# Patient Record
Sex: Female | Born: 2000 | Race: Black or African American | Hispanic: No | Marital: Single | Smoking: Never smoker
Health system: Southern US, Community
[De-identification: ages and names within clinical notes are randomized; demographics above are authoritative.]

## PROBLEM LIST (undated history)

## (undated) DIAGNOSIS — Z9049 Acquired absence of other specified parts of digestive tract: Secondary | ICD-10-CM

## (undated) DIAGNOSIS — J45909 Unspecified asthma, uncomplicated: Secondary | ICD-10-CM

## (undated) HISTORY — PX: KNEE SURGERY: SHX244

## (undated) HISTORY — PX: COLON SURGERY: SHX602

---

## 2020-01-05 ENCOUNTER — Ambulatory Visit: Payer: Self-pay

## 2020-01-22 ENCOUNTER — Ambulatory Visit: Payer: Managed Care, Other (non HMO) | Attending: Family

## 2020-01-22 DIAGNOSIS — Z23 Encounter for immunization: Secondary | ICD-10-CM

## 2020-02-16 ENCOUNTER — Encounter (HOSPITAL_COMMUNITY): Payer: Self-pay | Admitting: *Deleted

## 2020-02-16 ENCOUNTER — Emergency Department (HOSPITAL_COMMUNITY): Payer: Managed Care, Other (non HMO)

## 2020-02-16 ENCOUNTER — Other Ambulatory Visit: Payer: Self-pay

## 2020-02-16 ENCOUNTER — Observation Stay (HOSPITAL_COMMUNITY)
Admission: EM | Admit: 2020-02-16 | Discharge: 2020-02-17 | Disposition: A | Discharge status: Home / Self Care | Payer: Managed Care, Other (non HMO) | Attending: Internal Medicine | Admitting: Internal Medicine

## 2020-02-16 DIAGNOSIS — R6510 Systemic inflammatory response syndrome (SIRS) of non-infectious origin without acute organ dysfunction: Secondary | ICD-10-CM | POA: Diagnosis not present

## 2020-02-16 DIAGNOSIS — K8080 Other cholelithiasis without obstruction: Secondary | ICD-10-CM | POA: Insufficient documentation

## 2020-02-16 DIAGNOSIS — R000 Tachycardia, unspecified: Secondary | ICD-10-CM | POA: Diagnosis not present

## 2020-02-16 DIAGNOSIS — J45909 Unspecified asthma, uncomplicated: Secondary | ICD-10-CM | POA: Diagnosis not present

## 2020-02-16 DIAGNOSIS — R197 Diarrhea, unspecified: Principal | ICD-10-CM | POA: Diagnosis present

## 2020-02-16 DIAGNOSIS — K760 Fatty (change of) liver, not elsewhere classified: Secondary | ICD-10-CM | POA: Diagnosis not present

## 2020-02-16 DIAGNOSIS — Z20822 Contact with and (suspected) exposure to covid-19: Secondary | ICD-10-CM | POA: Insufficient documentation

## 2020-02-16 DIAGNOSIS — R112 Nausea with vomiting, unspecified: Secondary | ICD-10-CM | POA: Diagnosis present

## 2020-02-16 HISTORY — DX: Acquired absence of other specified parts of digestive tract: Z90.49

## 2020-02-16 HISTORY — DX: Unspecified asthma, uncomplicated: J45.909

## 2020-02-16 LAB — URINALYSIS, ROUTINE W REFLEX MICROSCOPIC
Bacteria, UA: NONE SEEN
Bilirubin Urine: NEGATIVE
Glucose, UA: NEGATIVE mg/dL
Hgb urine dipstick: NEGATIVE
Ketones, ur: NEGATIVE mg/dL
Leukocytes,Ua: NEGATIVE
Nitrite: NEGATIVE
Protein, ur: 30 mg/dL — AB
Specific Gravity, Urine: 1.033 — ABNORMAL HIGH (ref 1.005–1.030)
pH: 5 (ref 5.0–8.0)

## 2020-02-16 LAB — LIPASE, BLOOD: Lipase: 31 U/L (ref 11–51)

## 2020-02-16 LAB — CBC
HCT: 43.6 % (ref 36.0–46.0)
Hemoglobin: 14.6 g/dL (ref 12.0–15.0)
MCH: 28.8 pg (ref 26.0–34.0)
MCHC: 33.5 g/dL (ref 30.0–36.0)
MCV: 86 fL (ref 80.0–100.0)
Platelets: 321 10*3/uL (ref 150–400)
RBC: 5.07 MIL/uL (ref 3.87–5.11)
RDW: 13.2 % (ref 11.5–15.5)
WBC: 21.1 10*3/uL — ABNORMAL HIGH (ref 4.0–10.5)
nRBC: 0 % (ref 0.0–0.2)

## 2020-02-16 LAB — TROPONIN I (HIGH SENSITIVITY)
Troponin I (High Sensitivity): <2 ng/L (ref <18)
Troponin I (High Sensitivity): <2 ng/L (ref <18)

## 2020-02-16 LAB — COMPREHENSIVE METABOLIC PANEL
ALT: 20 U/L (ref 0–44)
AST: 22 U/L (ref 15–41)
Albumin: 4.4 g/dL (ref 3.5–5.0)
Alkaline Phosphatase: 66 U/L (ref 38–126)
Anion gap: 12 (ref 5–15)
BUN: 13 mg/dL (ref 6–20)
CO2: 18 mmol/L — ABNORMAL LOW (ref 22–32)
Calcium: 9.5 mg/dL (ref 8.9–10.3)
Chloride: 104 mmol/L (ref 98–111)
Creatinine, Ser: 0.95 mg/dL (ref 0.44–1.00)
GFR, Estimated: >60 mL/min (ref >60)
Glucose, Bld: 106 mg/dL — ABNORMAL HIGH (ref 70–99)
Potassium: 3.8 mmol/L (ref 3.5–5.1)
Sodium: 134 mmol/L — ABNORMAL LOW (ref 135–145)
Total Bilirubin: 1.2 mg/dL (ref 0.3–1.2)
Total Protein: 8.5 g/dL — ABNORMAL HIGH (ref 6.5–8.1)

## 2020-02-16 LAB — D-DIMER, QUANTITATIVE: D-Dimer, Quant: 0.44 ug/mL-FEU (ref 0.00–0.50)

## 2020-02-16 LAB — TSH: TSH: 1.335 u[IU]/mL (ref 0.350–4.500)

## 2020-02-16 LAB — I-STAT BETA HCG BLOOD, ED (MC, WL, AP ONLY): I-stat hCG, quantitative: <5 m[IU]/mL (ref <5)

## 2020-02-16 LAB — SARS CORONAVIRUS 2 (TAT 6-24 HRS): SARS Coronavirus 2: NEGATIVE

## 2020-02-16 MED ORDER — ACETAMINOPHEN 325 MG PO TABS
650.0000 mg | ORAL_TABLET | Freq: Once | ORAL | Status: AC
  Administered 2020-02-16: 650 mg via ORAL
  Filled 2020-02-16: qty 2

## 2020-02-16 MED ORDER — SODIUM CHLORIDE 0.9 % IV BOLUS
1000.0000 mL | Freq: Once | INTRAVENOUS | Status: AC
  Administered 2020-02-16: 1000 mL via INTRAVENOUS

## 2020-02-16 MED ORDER — IOHEXOL 300 MG/ML  SOLN
80.0000 mL | Freq: Once | INTRAMUSCULAR | Status: AC | PRN
  Administered 2020-02-16: 80 mL via INTRAVENOUS

## 2020-02-16 MED ORDER — DIPHENHYDRAMINE HCL 50 MG/ML IJ SOLN: 25.0000 mg | Freq: Once | INTRAMUSCULAR | Status: DC
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
## 2020-02-16 NOTE — ED Triage Notes (Signed)
Pt reports having n/v/d since getting up this am. No acute distress noted at triage.
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
## 2020-02-16 NOTE — ED Notes (Addendum)
This RN notified PA if another bag of fluids should be given due to pt still being sinus tach after 1L of NS already administered

## 2020-02-17 ENCOUNTER — Encounter (HOSPITAL_COMMUNITY): Payer: Self-pay | Admitting: Internal Medicine

## 2020-02-17 ENCOUNTER — Observation Stay (HOSPITAL_BASED_OUTPATIENT_CLINIC_OR_DEPARTMENT_OTHER): Payer: Managed Care, Other (non HMO)

## 2020-02-17 DIAGNOSIS — R000 Tachycardia, unspecified: Secondary | ICD-10-CM

## 2020-02-17 DIAGNOSIS — R112 Nausea with vomiting, unspecified: Secondary | ICD-10-CM

## 2020-02-17 DIAGNOSIS — R6510 Systemic inflammatory response syndrome (SIRS) of non-infectious origin without acute organ dysfunction: Secondary | ICD-10-CM

## 2020-02-17 DIAGNOSIS — R9431 Abnormal electrocardiogram [ECG] [EKG]: Secondary | ICD-10-CM | POA: Diagnosis not present

## 2020-02-17 LAB — MAGNESIUM: Magnesium: 1.2 mg/dL — ABNORMAL LOW (ref 1.7–2.4)

## 2020-02-17 LAB — CBC
HCT: 35.9 % — ABNORMAL LOW (ref 36.0–46.0)
Hemoglobin: 11.6 g/dL — ABNORMAL LOW (ref 12.0–15.0)
MCH: 28.2 pg (ref 26.0–34.0)
MCHC: 32.3 g/dL (ref 30.0–36.0)
MCV: 87.3 fL (ref 80.0–100.0)
Platelets: 230 10*3/uL (ref 150–400)
RBC: 4.11 MIL/uL (ref 3.87–5.11)
RDW: 13.2 % (ref 11.5–15.5)
WBC: 7 10*3/uL (ref 4.0–10.5)
nRBC: 0 % (ref 0.0–0.2)

## 2020-02-17 LAB — COMPREHENSIVE METABOLIC PANEL
ALT: 14 U/L (ref 0–44)
AST: 18 U/L (ref 15–41)
Albumin: 3.1 g/dL — ABNORMAL LOW (ref 3.5–5.0)
Alkaline Phosphatase: 63 U/L (ref 38–126)
Anion gap: 8 (ref 5–15)
BUN: 5 mg/dL — ABNORMAL LOW (ref 6–20)
CO2: 19 mmol/L — ABNORMAL LOW (ref 22–32)
Calcium: 8 mg/dL — ABNORMAL LOW (ref 8.9–10.3)
Chloride: 108 mmol/L (ref 98–111)
Creatinine, Ser: 0.72 mg/dL (ref 0.44–1.00)
GFR, Estimated: >60 mL/min (ref >60)
Glucose, Bld: 88 mg/dL (ref 70–99)
Potassium: 3.5 mmol/L (ref 3.5–5.1)
Sodium: 135 mmol/L (ref 135–145)
Total Bilirubin: 1.3 mg/dL — ABNORMAL HIGH (ref 0.3–1.2)
Total Protein: 6.2 g/dL — ABNORMAL LOW (ref 6.5–8.1)

## 2020-02-17 LAB — ECHOCARDIOGRAM COMPLETE
Area-P 1/2: 2.8 cm2
Height: 59 in
S' Lateral: 2.3 cm
Weight: 1968 oz

## 2020-02-17 LAB — HIV ANTIBODY (ROUTINE TESTING W REFLEX): HIV Screen 4th Generation wRfx: NONREACTIVE

## 2020-02-17 LAB — RAPID URINE DRUG SCREEN, HOSP PERFORMED
Amphetamines: NOT DETECTED
Barbiturates: NOT DETECTED
Benzodiazepines: NOT DETECTED
Cocaine: NOT DETECTED
Opiates: NOT DETECTED
Tetrahydrocannabinol: POSITIVE — AB

## 2020-02-17 MED ORDER — ONDANSETRON HCL 4 MG PO TABS: 4.0000 mg | ORAL_TABLET | Freq: Four times a day (QID) | ORAL | Status: DC | PRN

## 2020-02-17 MED ORDER — SODIUM CHLORIDE 0.9 % IV SOLN: INTRAVENOUS | Status: DC

## 2020-02-17 MED ORDER — ACETAMINOPHEN 650 MG RE SUPP: 650.0000 mg | Freq: Four times a day (QID) | RECTAL | Status: DC | PRN

## 2020-02-17 MED ORDER — ACETAMINOPHEN 325 MG PO TABS: 650.0000 mg | ORAL_TABLET | Freq: Four times a day (QID) | ORAL | Status: DC | PRN

## 2020-02-17 MED ORDER — ONDANSETRON HCL 4 MG/2ML IJ SOLN: 4.0000 mg | Freq: Four times a day (QID) | INTRAMUSCULAR | Status: DC | PRN

## 2020-02-17 MED ORDER — MAGNESIUM SULFATE 2 GM/50ML IV SOLN
2.0000 g | Freq: Once | INTRAVENOUS | Status: AC
  Administered 2020-02-17: 2 g via INTRAVENOUS
  Filled 2020-02-17: qty 50

## 2020-02-17 MED ORDER — SODIUM CHLORIDE 0.9 % IV BOLUS
1000.0000 mL | Freq: Once | INTRAVENOUS | Status: AC
  Administered 2020-02-17: 1000 mL via INTRAVENOUS

## 2020-02-17 MED ORDER — MAGNESIUM SULFATE IN D5W 1-5 GM/100ML-% IV SOLN: 1.0000 g | Freq: Once | INTRAVENOUS | Status: DC

## 2020-02-17 MED ORDER — POTASSIUM CHLORIDE CRYS ER 20 MEQ PO TBCR
40.0000 meq | EXTENDED_RELEASE_TABLET | Freq: Once | ORAL | Status: AC
  Administered 2020-02-17: 40 meq via ORAL
  Filled 2020-02-17: qty 2

## 2020-02-17 NOTE — Plan of Care (Signed)
Pt and mother understanding of plan of care

## 2020-02-17 NOTE — Discharge Instructions (Signed)
Follow with Primary MD /appointment has been arranged with [facility]  Get CBC, CMP,Mg, EKG during next visit.    Activity: As tolerated with Full fall precautions use walker/cane & assistance as needed   Disposition Home   Diet: Regular.   On your next visit with your primary care physician please Get Medicines reviewed and adjusted.   Please request your Prim.MD to go over all Hospital Tests and Procedure/Radiological results at the follow up, please get all Hospital records sent to your Prim MD by signing hospital release before you go home.   If you experience worsening of your admission symptoms, develop shortness of breath, life threatening emergency, suicidal or homicidal thoughts you must seek medical attention immediately by calling 911 or calling your MD immediately  if symptoms less severe.  You Must read complete instructions/literature along with all the possible adverse reactions/side effects for all the Medicines you take and that have been prescribed to you. Take any new Medicines after you have completely understood and accpet all the possible adverse reactions/side effects.   Do not drive, operating heavy machinery, perform activities at heights, swimming or participation in water activities or provide baby sitting services if your were admitted for syncope or siezures until you have seen by Primary MD or a Neurologist and advised to do so again.  Do not drive when taking Pain medications.    Do not take more than prescribed Pain, Sleep and Anxiety Medications  Special Instructions: If you have smoked or chewed Tobacco  in the last 2 yrs please stop smoking, stop any regular Alcohol  and or any Recreational drug use.  Wear Seat belts while driving.   Please note  You were cared for by a hospitalist during your hospital stay. If you have any questions about your discharge medications or the care you received while you were in the hospital after you  are discharged, you can call the unit and asked to speak with the hospitalist on call if the hospitalist that took care of you is not available. Once you are discharged, your primary care physician will handle any further medical issues. Please note that NO REFILLS for any discharge medications will be authorized once you are discharged, as it is imperative that you return to your primary care physician (or establish a relationship with a primary care physician if you do not have one) for your aftercare needs so that they can reassess your need for medications and monitor your lab values.

## 2020-02-17 NOTE — Discharge Summary (Signed)
Katrina Dominguez, is a 20 y.o. female  DOB [birth year]  MRN [id number].  Admission date:  02/16/2020  Admitting Physician  Rometta Emery, MD  Discharge Date:  02/17/2020   Primary MD  Pcp, No  Recommendations for primary care physician for things to follow:  -Please check CBC, CMP, magnesium level during next visit. -Repeat EKG during next visit -Please consider referral to cardiology if she remains tachycardic.   Admission Diagnosis  Tachycardia [R00.0] SIRS (systemic inflammatory response syndrome) (HCC) [R65.10] Nausea vomiting and diarrhea [R11.2, R19.7]   Discharge Diagnosis  Tachycardia [R00.0] SIRS (systemic inflammatory response syndrome) (HCC) [R65.10] Nausea vomiting and diarrhea [R11.2, R19.7]    Principal Problem:   SIRS (systemic inflammatory response syndrome) (HCC) Active Problems:   Sinus tachycardia   Nausea & vomiting   Diarrhea      Past Medical History:  Diagnosis Date  . Asthma   . S/P small bowel resection     History reviewed. No pertinent surgical history.     History of present illness and  Hospital Course:     Kindly see H&P for history of present illness and admission details, please review complete Labs, Consult reports and Test reports for all details in brief  HPI  from the history and physical done on the day of admission 02/16/2020  HPI: Katrina Dominguez is a 20 y.o. female with medical history significant of asthma and previous small bowel resection who presented to the ER with significant history of nausea vomiting diarrhea since yesterday followed by weakness.  Patient was seen in the ER and noted to have significant sinus tachycardia.  She was given fluid boluses.  Also treated symptomatically.  Nausea vomiting and diarrhea have since stopped.  Patient's tachycardia has persisted.  Is been going into the 140s and 150s.  Patient not showing any signs of  improvement.  No chest pain.  Denied any drug use.  Patient has been anxious.  Reported having a similar episode before when she was told she was only anxious.  At this point there is worrisome for possible cardiomyopathy.  Drug screen is however not done yet by the ER.  Patient will be admitted for observation.  We may order an echocardiogram if drug screen is negative..  ED Course: Temperature 100.5 blood pressure 93/70 pulse 146 respirate of 35 oxygen sat 97% on room air.  Urinalysis essentially negative.  COVID-19 negative TSH 1.335.  Troponin negative D-dimer 0.44.  Chest x-ray shows no significant findings.  Urinalysis negative.  Hospital Course   Nausea/vomiting and diarrhea -This has resolved, CT abdomen pelvis with no acute findings, this is most likely due to to food poisoning which has currently resolved she reports she did eat some warm mashed potatoes, tolerating oral intake with no nausea or vomiting, no abdominal pain. -Leukocytosis most likely stress related from her nausea and vomiting, this has resolved with hydration, she is nontoxic-appearing.  Sinus tachycardia -This is likely chronic problem as she reports she has been told to follow-up with cardiology as  an outpatient during ED visit given her sinus tachycardia, this has significantly improved with IV hydration, there is some underlying dehydration contributing to it, but she has been appropriately fluid resuscitated, rate in the 90s at rest, but up to 120s with activity. -2D echo was obtained, with no acute findings, repeat EKG with normal sinus rhythm, no acute findings, I have discussed with cardiology on-call Dr. Reggy EyeAcharryia  who reviewed her EKG, no acute findings.  She is to repeat EKG during next visit at PCP, and if she remains tachycardic then would recommend outpatient referral to cardiology for further evaluation. -TSH within normal limit  Hypomagnesemia -Repleted    Discharge Condition:  stable   Follow  UP   Follow-up Information    [facility] Follow up.   Why: March 11, 2020 at 1:50 pm Contact information: [address] [place] [id number] [phone]                Discharge Instructions  and  Discharge Medications     Discharge Instructions    Diet - low sodium heart healthy   Complete by: As directed    Discharge instructions   Complete by: As directed    Follow with Primary MD /appointment has been arranged with [facility]  Get CBC, CMP,Mg, EKG during next visit.    Activity: As tolerated with Full fall precautions use walker/cane & assistance as needed   Disposition Home   Diet: Regular.   On your next visit with your primary care physician please Get Medicines reviewed and adjusted.   Please request your Prim.MD to go over all Hospital Tests and Procedure/Radiological results at the follow up, please get all Hospital records sent to your Prim MD by signing hospital release before you go home.   If you experience worsening of your admission symptoms, develop shortness of breath, life threatening emergency, suicidal or homicidal thoughts you must seek medical attention immediately by calling 911 or calling your MD immediately  if symptoms less severe.  You Must read complete instructions/literature along with all the possible adverse reactions/side effects for all the Medicines you take and that have been prescribed to you. Take any new Medicines after you have completely understood and accpet all the possible adverse reactions/side effects.   Do not drive, operating heavy machinery, perform activities at heights, swimming or participation in water activities or provide baby sitting services if your were admitted for syncope or siezures until you have seen by Primary MD or a Neurologist and advised to do so again.  Do not drive when taking Pain medications.    Do not take more than  prescribed Pain, Sleep and Anxiety Medications  Special Instructions: If you have smoked or chewed Tobacco  in the last 2 yrs please stop smoking, stop any regular Alcohol  and or any Recreational drug use.  Wear Seat belts while driving.   Please note  You were cared for by a hospitalist during your hospital stay. If you have any questions about your discharge medications or the care you received while you were in the hospital after you are discharged, you can call the unit and asked to speak with the hospitalist on call if the hospitalist that took care of you is not available. Once you are discharged, your primary care physician will handle any further medical issues. Please note that NO REFILLS for any discharge medications will be authorized once you are discharged, as it is imperative  that you return to your primary care physician (or establish a relationship with a primary care physician if you do not have one) for your aftercare needs so that they can reassess your need for medications and monitor your lab values.   Increase activity slowly   Complete by: As directed      Allergies as of 02/17/2020   No Known Allergies     Medication List    You have not been prescribed any medications.       Diet and Activity recommendation: See Discharge Instructions above   Consults obtained -  None   Major procedures and Radiology Reports - PLEASE review detailed and final reports for all details, in brief -      CT Abdomen Pelvis W Contrast  Result Date: 02/16/2020 CLINICAL DATA:  Abdominal pain for 1 day EXAM: CT ABDOMEN AND PELVIS WITH CONTRAST TECHNIQUE: Multidetector CT imaging of the abdomen and pelvis was performed using the standard protocol following bolus administration of intravenous contrast. CONTRAST:  80mL OMNIPAQUE IOHEXOL 300 MG/ML  SOLN COMPARISON:  None. FINDINGS: Lower chest: No acute abnormality. Hepatobiliary: Fatty infiltration of the liver is noted.  Gallbladder is decompressed with gallstones within. No complicating factors are noted. Pancreas: Unremarkable. No pancreatic ductal dilatation or surrounding inflammatory changes. Spleen: Normal in size without focal abnormality. Adrenals/Urinary Tract: Adrenal glands are within normal limits. Kidneys demonstrate a normal enhancement pattern bilaterally. No renal calculi or obstructive changes are seen. The bladder is decompressed. Stomach/Bowel: The appendix is not well visualized. Postsurgical changes in proximal right colon are noted. The anastomotic site is widely patent. No obstructive changes are seen. Vascular/Lymphatic: No significant vascular findings are present. No enlarged abdominal or pelvic lymph nodes. Reproductive: Uterus and bilateral adnexa are unremarkable. Other: No abdominal wall hernia or abnormality. No abdominopelvic ascites. Musculoskeletal: No acute or significant osseous findings. IMPRESSION: Fatty liver. Cholelithiasis without complicating factors. Postsurgical changes in the right lower quadrant consistent with the prior small bowel resection. No other focal abnormality is noted. Electronically Signed   By: Alcide Clever M.D.   On: 02/16/2020 12:57   DG Chest Portable 1 View  Result Date: 02/16/2020 CLINICAL DATA:  Tachycardia, nausea/vomiting/diarrhea for 1 day EXAM: PORTABLE CHEST 1 VIEW COMPARISON:  None FINDINGS: Single frontal view of the chest excludes the apices by collimation. No airspace disease, effusion, or pneumothorax. There are no acute bony abnormalities. IMPRESSION: 1. No acute intrathoracic process. Electronically Signed   By: Sharlet Salina M.D.   On: 02/16/2020 20:09   ECHOCARDIOGRAM COMPLETE  Result Date: 02/17/2020    ECHOCARDIOGRAM REPORT   Patient Name:   Katrina Dominguez Date of Exam: 02/17/2020 Medical Rec #:  [id number]    Height:       59.0 in Accession #:    [id number]   Weight:       123.0 lb Date of Birth:  24-Nov-2000    BSA:          1.500 m Patient Age:     19 years     BP:           96/64 mmHg Patient Gender: F            HR:           103 bpm. Exam Location:  Inpatient Procedure: 2D Echo Indications:    Sinus tachycardia [[id number]  History:        Patient has no prior history of Echocardiogram examinations.  Risk Factors:Non-Smoker. SIRS, Sinus Tachycardia, Small bowel                 resection.  Sonographer:    Jeryl Columbia Referring Phys: [address] [place] IMPRESSIONS  1. Left ventricular ejection fraction, by estimation, is 60 to 65%. The left ventricle has normal function. The left ventricle has no regional wall motion abnormalities. Left ventricular diastolic parameters were normal.  2. Right ventricular systolic function is normal. The right ventricular size is normal. There is normal pulmonary artery systolic pressure.  3. The mitral valve is normal in structure. No evidence of mitral valve regurgitation. No evidence of mitral stenosis.  4. The aortic valve is normal in structure. Aortic valve regurgitation is not visualized. No aortic stenosis is present.  5. The inferior vena cava is normal in size with greater than 50% respiratory variability, suggesting right atrial pressure of 3 mmHg. FINDINGS  Left Ventricle: Left ventricular ejection fraction, by estimation, is 60 to 65%. The left ventricle has normal function. The left ventricle has no regional wall motion abnormalities. The left ventricular internal cavity size was normal in size. There is  no left ventricular hypertrophy. Left ventricular diastolic parameters were normal. Right Ventricle: The right ventricular size is normal. No increase in right ventricular wall thickness. Right ventricular systolic function is normal. There is normal pulmonary artery systolic pressure. The tricuspid regurgitant velocity is 1.86 m/s, and  with an assumed right atrial pressure of 3 mmHg, the estimated right ventricular systolic pressure is 16.8 mmHg. Left Atrium: Left atrial size was normal in  size. Right Atrium: Right atrial size was normal in size. Pericardium: There is no evidence of pericardial effusion. Mitral Valve: The mitral valve is normal in structure. No evidence of mitral valve regurgitation. No evidence of mitral valve stenosis. Tricuspid Valve: The tricuspid valve is normal in structure. Tricuspid valve regurgitation is trivial. No evidence of tricuspid stenosis. Aortic Valve: The aortic valve is normal in structure. Aortic valve regurgitation is not visualized. No aortic stenosis is present. Pulmonic Valve: The pulmonic valve was normal in structure. Pulmonic valve regurgitation is not visualized. No evidence of pulmonic stenosis. Aorta: The aortic root is normal in size and structure. Venous: The inferior vena cava is normal in size with greater than 50% respiratory variability, suggesting right atrial pressure of 3 mmHg. IAS/Shunts: No atrial level shunt detected by color flow Doppler.  LEFT VENTRICLE PLAX 2D LVIDd:         3.30 cm  Diastology LVIDs:         2.30 cm  LV e' medial:    13.60 cm/s LV PW:         1.00 cm  LV E/e' medial:  9.4 LV IVS:        0.90 cm  LV e' lateral:   15.90 cm/s LVOT diam:     1.70 cm  LV E/e' lateral: 8.1 LVOT Area:     2.27 cm  RIGHT VENTRICLE RV S prime:     17.60 cm/s TAPSE (M-mode): 1.9 cm LEFT ATRIUM           Index       RIGHT ATRIUM           Index LA diam:      2.60 cm 1.73 cm/m  RA Area:     10.60 cm LA Vol (A2C): 23.9 ml 15.93 ml/m RA Volume:   26.30 ml  17.53 ml/m LA Vol (A4C): 29.8 ml 19.87 ml/m  AORTA Ao Root diam: 2.60 cm MITRAL VALVE                TRICUSPID VALVE MV Area (PHT): 2.80 cm     TR Peak grad:   13.8 mmHg MV Decel Time: 271 msec     TR Vmax:        186.00 cm/s MV E velocity: 128.00 cm/s                             SHUNTS                             Systemic Diam: 1.70 cm Rachelle Hora Croitoru MD Electronically signed by Thurmon Fair MD Signature Date/Time: 02/17/2020/10:38:12 AM    Final     Micro Results   Recent Results (from  the past 240 hour(s))  SARS CORONAVIRUS 2 (TAT 6-24 HRS) Nasopharyngeal Nasopharyngeal Swab     Status: None   Collection Time: 02/16/20  1:35 PM   Specimen: Nasopharyngeal Swab  Result Value Ref Range Status   SARS Coronavirus 2 NEGATIVE NEGATIVE Final    Comment: (NOTE) SARS-CoV-2 target nucleic acids are NOT DETECTED.  The SARS-CoV-2 RNA is generally detectable in upper and lower respiratory specimens during the acute phase of infection. Negative results do not preclude SARS-CoV-2 infection, do not rule out co-infections with other pathogens, and should not be used as the sole basis for treatment or other patient management decisions. Negative results must be combined with clinical observations, patient history, and epidemiological information. The expected result is Negative.  Fact Sheet for Patients: HairSlick.no  Fact Sheet for Healthcare Providers: quierodirigir.com  This test is not yet approved or cleared by the [place] FDA and  has been authorized for detection and/or diagnosis of SARS-CoV-2 by FDA under an Emergency Use Authorization (EUA). This EUA will remain  in effect (meaning this test can be used) for the duration of the COVID-19 declaration under Se ction 564(b)(1) of the Act, 21 U.S.C. section 360bbb-3(b)(1), unless the authorization is terminated or revoked sooner.  Performed at [facility], [address] [place], [place] [address]        Today   Subjective:   Katrina Dominguez today has no headache,no chest or  abdominal pain, no nausea, no vomiting currently, tolerating oral intake, no dizziness or lightheadedness.    Objective:   Blood pressure 101/69, pulse 93, temperature 98.2 F (36.8 C), temperature source Oral, resp. rate 14, height 4\' 11"  (1.499 m), weight 55.8 kg, last menstrual period 01/10/2020, SpO2 100 %.   Intake/Output Summary (Last 24 hours) at 02/17/2020 1511 Last  data filed at 02/17/2020 1509 Gross per 24 hour  Intake 4042.88 ml  Output -  Net 4042.88 ml    Exam Awake Alert, Oriented x 3, No new F.N deficits, Normal affect Symmetrical Chest wall movement, Good air movement bilaterally, CTAB RRR,No Gallops,Rubs or new Murmurs, No Parasternal Heave +ve B.Sounds, Abd Soft, Non tender, No rebound -guarding or rigidity. No Cyanosis, Clubbing or edema, No new Rash or bruise  Data Review   CBC w Diff:  Lab Results  Component Value Date   WBC 7.0 02/17/2020   HGB 11.6 (L) 02/17/2020   HCT 35.9 (L) 02/17/2020   PLT 230 02/17/2020    CMP:  Lab Results  Component Value Date   NA 135 02/17/2020   K 3.5 02/17/2020  CL 108 02/17/2020   CO2 19 (L) 02/17/2020   BUN 5 (L) 02/17/2020   CREATININE 0.72 02/17/2020   PROT 6.2 (L) 02/17/2020   ALBUMIN 3.1 (L) 02/17/2020   BILITOT 1.3 (H) 02/17/2020   ALKPHOS 63 02/17/2020   AST 18 02/17/2020   ALT 14 02/17/2020  .   Total Time in preparing paper work, data evaluation and todays exam - 35 minutes  Huey Bienenstock M.D on 02/17/2020 at 3:11 PM  [facility]   Office  [phone]

## 2020-02-17 NOTE — Progress Notes (Signed)
*  PRELIMINARY RESULTS* Echocardiogram 2D Echocardiogram has been performed.  Katrina Dominguez 02/17/2020, 10:14 AM

## 2020-02-17 NOTE — Progress Notes (Signed)
Mariah Milling to be D/C'd Home per MD order.  Discussed with the patient and all questions fully answered.   VSS, Skin clean, dry and intact without evidence of skin break down, no evidence of skin tears noted. IV catheter discontinued intact. Site without signs and symptoms of complications. Dressing and pressure applied.   An After Visit Summary was printed and given to the patient.    D/C education completed with patient/family including follow up instructions, medication list, d/c activities limitations if indicated, with other d/c instructions as indicated by MD - patient able to verbalize understanding, all questions fully answered.    Patient instructed to return to ED, call 911, or call MD for any changes in condition.    Patient escorted via WC, and D/C home via private car with mother.

## 2020-02-17 NOTE — TOC Initial Note (Signed)
Transition of Care [facility]) - Initial/Assessment Note    Patient Details  Name: Katrina Dominguez MRN: [id number] Date of Birth: 09/16/2000  [facility] CM/SW Contact:    Kingsley Plan, RN Phone Number: 02/17/2020, 1:36 PM  Clinical Narrative:                 Patient from home. Needs PCP.   NCM explained she can call number on insurance card and be provided with a complete list of providers in network with her insurance. Or if she knows of a provider she wants to establish care with , she can call that providers office directly and see if they are accepting new patient's.   NCM discussed [facility]. Patient consented for NCM to call [facility] and schedule an appointment. Same done and placed on AVS   Expected Discharge Plan: Home/Self Care     Patient Goals and CMS Choice   CMS Medicare.gov Compare Post Acute Care list provided to:: Patient Choice offered to / list presented to : Patient (PCP)  Expected Discharge Plan and Services Expected Discharge Plan: Home/Self Care In-house Referral: PCP / [facility] Discharge Planning Services: CM Consult   Living arrangements for the past 2 months: Apartment                 DME Arranged: N/A DME Agency: NA       HH Arranged: NA          Prior Living Arrangements/Services Living arrangements for the past 2 months: Apartment Lives with:: Parents Patient language and need for interpreter reviewed:: Yes Do you feel safe going back to the place where you live?: Yes      Need for Family Participation in Patient Care: No (Comment) Care giver support system in place?: Yes (comment)   Criminal Activity/Legal Involvement Pertinent to Current Situation/Hospitalization: No - Comment as needed  Activities of Daily Living Home Assistive Devices/Equipment: None ADL Screening (condition at time of admission) Patient's cognitive ability adequate to safely complete daily activities?: Yes Is  the patient deaf or have difficulty hearing?: No Does the patient have difficulty seeing, even when wearing glasses/contacts?: No Does the patient have difficulty concentrating, remembering, or making decisions?: No Patient able to express need for assistance with ADLs?: Yes Does the patient have difficulty dressing or bathing?: No Independently performs ADLs?: Yes (appropriate for developmental age) Does the patient have difficulty walking or climbing stairs?: No Weakness of Legs: None Weakness of Arms/Hands: None  Permission Sought/Granted   Permission granted to share information with : No              Emotional Assessment Appearance:: Appears stated age Attitude/Demeanor/Rapport: Engaged Affect (typically observed): Accepting Orientation: : Oriented to Self,Oriented to Place,Oriented to  Time,Oriented to Situation Alcohol / Substance Use: Not Applicable Psych Involvement: No (comment)  Admission diagnosis:  Tachycardia [R00.0] SIRS (systemic inflammatory response syndrome) (HCC) [R65.10] Nausea vomiting and diarrhea [R11.2, R19.7] Patient Active Problem List   Diagnosis Date Noted  . SIRS (systemic inflammatory response syndrome) (HCC) 02/16/2020  . Sinus tachycardia 02/16/2020  . Nausea & vomiting 02/16/2020  . Diarrhea 02/16/2020   PCP:  Pcp, No Pharmacy:  No Pharmacies Listed    Social Determinants of Health (SDOH) Interventions    Readmission Risk Interventions No flowsheet data found.

## 2020-02-17 NOTE — H&P (Signed)
History and Physical   Kery Haltiwanger NIO:[id number] DOB: Feb 26, 2000 DOA: 02/16/2020  Referring MD/NP/PA: Dr. Anitra Lauth  PCP: Pcp, No   Outpatient Specialists: None  Patient coming from: Home  Chief Complaint: Nausea vomiting diarrhea  HPI: Katrina Dominguez is a 20 y.o. female with medical history significant of asthma and previous small bowel resection who presented to the ER with significant history of nausea vomiting diarrhea since yesterday followed by weakness.  Patient was seen in the ER and noted to have significant sinus tachycardia.  She was given fluid boluses.  Also treated symptomatically.  Nausea vomiting and diarrhea have since stopped.  Patient's tachycardia has persisted.  Is been going into the 140s and 150s.  Patient not showing any signs of improvement.  No chest pain.  Denied any drug use.  Patient has been anxious.  Reported having a similar episode before when she was told she was only anxious.  At this point there is worrisome for possible cardiomyopathy.  Drug screen is however not done yet by the ER.  Patient will be admitted for observation.  We may order an echocardiogram if drug screen is negative..  ED Course: Temperature 100.5 blood pressure 93/70 pulse 146 respirate of 35 oxygen sat 97% on room air.  Urinalysis essentially negative.  COVID-19 negative TSH 1.335.  Troponin negative D-dimer 0.44.  Chest x-ray shows no significant findings.  Urinalysis negative.  Review of Systems: As per HPI otherwise 10 point review of systems negative.    Past Medical History:  Diagnosis Date  . Asthma   . S/P small bowel resection     History reviewed. No pertinent surgical history.   reports that she has never smoked. She has never used smokeless tobacco. She reports that she does not drink alcohol and does not use drugs.  No Known Allergies  History reviewed. No pertinent family history.   Prior to Admission medications   Not on File    Physical Exam: Vitals:    02/16/20 2030 02/16/20 2230 02/16/20 2238 02/16/20 2300  BP: 103/78 103/83    Pulse: (!) 128 (!) 146 (!) 120 (!) 114  Resp: 20 (!) 26 12 (!) 25  Temp:  99.6 F (37.6 C)    TempSrc:  Oral    SpO2: 99% 100% 98% 99%  Weight:      Height:          Constitutional: Mildly anxious, no distress Vitals:   02/16/20 2030 02/16/20 2230 02/16/20 2238 02/16/20 2300  BP: 103/78 103/83    Pulse: (!) 128 (!) 146 (!) 120 (!) 114  Resp: 20 (!) 26 12 (!) 25  Temp:  99.6 F (37.6 C)    TempSrc:  Oral    SpO2: 99% 100% 98% 99%  Weight:      Height:       Eyes: PERRL, lids and conjunctivae normal ENMT: Mucous membranes are moist. Posterior pharynx clear of any exudate or lesions.Normal dentition.  Neck: normal, supple, no masses, no thyromegaly Respiratory: clear to auscultation bilaterally, no wheezing, no crackles. Normal respiratory effort. No accessory muscle use.  Cardiovascular: Sinus tachycardia no murmurs / rubs / gallops. No extremity edema. 2+ pedal pulses. No carotid bruits.  Abdomen: no tenderness, no masses palpated. No hepatosplenomegaly. Bowel sounds positive.  Musculoskeletal: no clubbing / cyanosis. No joint deformity upper and lower extremities. Good ROM, no contractures. Normal muscle tone.  Skin: no rashes, lesions, ulcers. No induration Neurologic: CN 2-12 grossly intact. Sensation intact, DTR normal. Strength 5/5 in  all 4.  Psychiatric: Normal judgment and insight. Alert and oriented x 3.  Anxious mood.     Labs on Admission: I have personally reviewed following labs and imaging studies  CBC: Recent Labs  Lab 02/16/20 1000  WBC 21.1*  HGB 14.6  HCT 43.6  MCV 86.0  PLT 321   Basic Metabolic Panel: Recent Labs  Lab 02/16/20 1000  NA 134*  K 3.8  CL 104  CO2 18*  GLUCOSE 106*  BUN 13  CREATININE 0.95  CALCIUM 9.5   GFR: Estimated Creatinine Clearance: 71.3 mL/min (by C-G formula based on SCr of 0.95 mg/dL). Liver Function Tests: Recent Labs  Lab  02/16/20 1000  AST 22  ALT 20  ALKPHOS 66  BILITOT 1.2  PROT 8.5*  ALBUMIN 4.4   Recent Labs  Lab 02/16/20 1000  LIPASE 31   No results for input(s): AMMONIA in the last 168 hours. Coagulation Profile: No results for input(s): INR, PROTIME in the last 168 hours. Cardiac Enzymes: No results for input(s): CKTOTAL, CKMB, CKMBINDEX, TROPONINI in the last 168 hours. BNP (last 3 results) No results for input(s): PROBNP in the last 8760 hours. HbA1C: No results for input(s): HGBA1C in the last 72 hours. CBG: No results for input(s): GLUCAP in the last 168 hours. Lipid Profile: No results for input(s): CHOL, HDL, LDLCALC, TRIG, CHOLHDL, LDLDIRECT in the last 72 hours. Thyroid Function Tests: Recent Labs    02/16/20 1721  TSH 1.335   Anemia Panel: No results for input(s): VITAMINB12, FOLATE, FERRITIN, TIBC, IRON, RETICCTPCT in the last 72 hours. Urine analysis:    Component Value Date/Time   COLORURINE AMBER (A) 02/16/2020 1120   APPEARANCEUR HAZY (A) 02/16/2020 1120   LABSPEC 1.033 (H) 02/16/2020 1120   PHURINE 5.0 02/16/2020 1120   GLUCOSEU NEGATIVE 02/16/2020 1120   HGBUR NEGATIVE 02/16/2020 1120   BILIRUBINUR NEGATIVE 02/16/2020 1120   KETONESUR NEGATIVE 02/16/2020 1120   PROTEINUR 30 (A) 02/16/2020 1120   NITRITE NEGATIVE 02/16/2020 1120   LEUKOCYTESUR NEGATIVE 02/16/2020 1120   Sepsis Labs: @LABRCNTIP (procalcitonin:4,lacticidven:4) ) Recent Results (from the past 240 hour(s))  SARS CORONAVIRUS 2 (TAT 6-24 HRS) Nasopharyngeal Nasopharyngeal Swab     Status: None   Collection Time: 02/16/20  1:35 PM   Specimen: Nasopharyngeal Swab  Result Value Ref Range Status   SARS Coronavirus 2 NEGATIVE NEGATIVE Final    Comment: (NOTE) SARS-CoV-2 target nucleic acids are NOT DETECTED.  The SARS-CoV-2 RNA is generally detectable in upper and lower respiratory specimens during the acute phase of infection. Negative results do not preclude SARS-CoV-2 infection, do not rule  out co-infections with other pathogens, and should not be used as the sole basis for treatment or other patient management decisions. Negative results must be combined with clinical observations, patient history, and epidemiological information. The expected result is Negative.  Fact Sheet for Patients: 02/18/20  Fact Sheet for Healthcare Providers: HairSlick.no  This test is not yet approved or cleared by the quierodirigir.com FDA and  has been authorized for detection and/or diagnosis of SARS-CoV-2 by FDA under an Emergency Use Authorization (EUA). This EUA will remain  in effect (meaning this test can be used) for the duration of the COVID-19 declaration under Se ction 564(b)(1) of the Act, 21 U.S.C. section 360bbb-3(b)(1), unless the authorization is terminated or revoked sooner.  Performed at [facility], [address] [place], [place]      Radiological Exams on Admission: CT Abdomen Pelvis W Contrast  Result Date: 02/16/2020 CLINICAL DATA:  Abdominal pain for 1 day EXAM: CT ABDOMEN AND PELVIS WITH CONTRAST TECHNIQUE: Multidetector CT imaging of the abdomen and pelvis was performed using the standard protocol following bolus administration of intravenous contrast. CONTRAST:  80mL OMNIPAQUE IOHEXOL 300 MG/ML  SOLN COMPARISON:  None. FINDINGS: Lower chest: No acute abnormality. Hepatobiliary: Fatty infiltration of the liver is noted. Gallbladder is decompressed with gallstones within. No complicating factors are noted. Pancreas: Unremarkable. No pancreatic ductal dilatation or surrounding inflammatory changes. Spleen: Normal in size without focal abnormality. Adrenals/Urinary Tract: Adrenal glands are within normal limits. Kidneys demonstrate a normal enhancement pattern bilaterally. No renal calculi or obstructive changes are seen. The bladder is decompressed. Stomach/Bowel: The appendix is not well  visualized. Postsurgical changes in proximal right colon are noted. The anastomotic site is widely patent. No obstructive changes are seen. Vascular/Lymphatic: No significant vascular findings are present. No enlarged abdominal or pelvic lymph nodes. Reproductive: Uterus and bilateral adnexa are unremarkable. Other: No abdominal wall hernia or abnormality. No abdominopelvic ascites. Musculoskeletal: No acute or significant osseous findings. IMPRESSION: Fatty liver. Cholelithiasis without complicating factors. Postsurgical changes in the right lower quadrant consistent with the prior small bowel resection. No other focal abnormality is noted. Electronically Signed   By: Alcide CleverMark  Lukens M.D.   On: 02/16/2020 12:57   DG Chest Portable 1 View  Result Date: 02/16/2020 CLINICAL DATA:  Tachycardia, nausea/vomiting/diarrhea for 1 day EXAM: PORTABLE CHEST 1 VIEW COMPARISON:  None FINDINGS: Single frontal view of the chest excludes the apices by collimation. No airspace disease, effusion, or pneumothorax. There are no acute bony abnormalities. IMPRESSION: 1. No acute intrathoracic process. Electronically Signed   By: Sharlet SalinaMichael  Brown M.D.   On: 02/16/2020 20:09    EKG: Independently reviewed.  It shows sinus tachycardia with a rate of 121.  No significant findings.  Assessment/Plan Principal Problem:   SIRS (systemic inflammatory response syndrome) (HCC) Active Problems:   Sinus tachycardia   Nausea & vomiting   Diarrhea     #1 SIRS: Patient has no source of infection but meets criteria for SIRS.  I strongly suspect drug of abuse in this individual.  She does have previous GI surgery.  She does not appear dehydrated.  I will order an urine drug screen in the meantime hydrate the patient.  Monitor closely.  #2 persistent sinus tachycardia: Patient may benefit from echocardiogram to rule out cardiomyopathy if her urine drug screen is negative.  #3 nausea vomiting and diarrhea: This appears to be resolved.   Continue to monitor   DVT prophylaxis: SCD Code Status: Full code Family Communication: Boyfriend at bedside Disposition Plan: Home Consults called: None Admission status: Observation  Severity of Illness: The appropriate patient status for this patient is OBSERVATION. Observation status is judged to be reasonable and necessary in order to provide the required intensity of service to ensure the patient's safety. The patient's presenting symptoms, physical exam findings, and initial radiographic and laboratory data in the context of their medical condition is felt to place them at decreased risk for further clinical deterioration. Furthermore, it is anticipated that the patient will be medically stable for discharge from the hospital within 2 midnights of admission. The following factors support the patient status of observation.   " The patient's presenting symptoms include nausea vomiting diarrhea and palpitations. " The physical exam findings include sinus tachycardia. " The initial radiographic and laboratory data are mostly within normal.     Admire Bunnell,LAWAL MD [facility] Pager [phone]  If 7PM-7AM, please contact night-coverage [email or web address] Password [facility]  02/17/2020, 12:03 AM

## 2020-02-17 NOTE — ED Notes (Signed)
Waiting to call report b/c pt may be discharged per MD. Finishing ECHO and fluids and repeat ekg for final decision.

## 2020-03-10 NOTE — Progress Notes (Deleted)
Patient ID: Katrina Dominguez, female   DOB: 06-17-2000, 20 y.o.   MRN: [id number]   After hospitalization 2/14-2/15/2022:  From discharge summary: Recommendations for primary care physician for things to follow:  -Please check CBC, CMP, magnesium level during next visit. -Repeat EKG during next visit -Please consider referral to cardiology if she remains tachycardic.   Admission Diagnosis  Tachycardia [R00.0] SIRS (systemic inflammatory response syndrome) (HCC) [R65.10] Nausea vomiting and diarrhea [R11.2, R19.7]   Discharge Diagnosis  Tachycardia [R00.0] SIRS (systemic inflammatory response syndrome) (HCC) [R65.10] Nausea vomiting and diarrhea [R11.2, R19.7]    Principal Problem:   SIRS (systemic inflammatory response syndrome) (HCC) Active Problems:   Sinus tachycardia   Nausea & vomiting   Diarrhea  PYK:DXIPJAS Wadeis a 19 y.o.femalewith medical history significant ofasthma and previous small bowel resection who presented to the ER with significant history of nausea vomiting diarrhea since yesterday followed by weakness. Patient was seen in the ER and noted to have significant sinus tachycardia. She was given fluid boluses. Also treated symptomatically. Nausea vomiting and diarrhea have since stopped. Patient's tachycardia has persisted. Is been going into the 140s and 150s. Patient not showing any signs of improvement. No chest pain. Denied any drug use. Patient has been anxious. Reported having a similar episode before when she was told she was only anxious. At this point there is worrisome for possible cardiomyopathy. Drug screen is however not done yet by the ER. Patient will be admitted for observation. We may order an echocardiogram if drug screen is negative..  ED Course:Temperature 100.5 blood pressure 93/70 pulse 146 respirate of 35 oxygen sat 97% on room air. Urinalysis essentially negative. COVID-19 negative TSH 1.335. Troponin negative D-dimer  0.44. Chest x-ray shows no significant findings. Urinalysis negative.  Hospital Course   Nausea/vomiting and diarrhea -This has resolved, CT abdomen pelvis with no acute findings, this is most likely due to to food poisoning which has currently resolved she reports she did eat some warm mashed potatoes, tolerating oral intake with no nausea or vomiting, no abdominal pain. -Leukocytosis most likely stress related from her nausea and vomiting, this has resolved with hydration, she is nontoxic-appearing.  Sinus tachycardia -This is likely chronic problem as she reports she has been told to follow-up with cardiology as an outpatient during ED visit given her sinus tachycardia, this has significantly improved with IV hydration, there is some underlying dehydration contributing to it, but she has been appropriately fluid resuscitated, rate in the 90s at rest, but up to 120s with activity. -2D echo was obtained, with no acute findings, repeat EKG with normal sinus rhythm, no acute findings, I have discussed with cardiology on-call Dr. Reggy Eye  who reviewed her EKG, no acute findings.  She is to repeat EKG during next visit at PCP, and if she remains tachycardic then would recommend outpatient referral to cardiology for further evaluation. -TSH within normal limit  Hypomagnesemia -Repleted

## 2020-03-11 ENCOUNTER — Inpatient Hospital Stay: Payer: Managed Care, Other (non HMO) | Admitting: Physician Assistant

## 2020-04-15 ENCOUNTER — Inpatient Hospital Stay: Payer: Managed Care, Other (non HMO) | Admitting: Physician Assistant

## 2020-04-28 NOTE — Progress Notes (Signed)
Patient referred by Renaye Rakers, MD for tachycardia  Subjective:   Katrina Dominguez, female    DOB: 26-Jul-2000, 20 y.o.   MRN: [id number]   Chief Complaint  Patient presents with  . Tachycardia  . New Patient (Initial Visit)     HPI  20 y.o. African American female with tachycardia  Patient is currently a Archivist. She was noted to have tachycardia during hospitalization in 02/2020. She has noticed her resting heart rate in 120s without exercise at times. She denies chest pain, shortness of breath, leg edema, orthopnea, PND, TIA/syncope.   Past Medical History:  Diagnosis Date  . Asthma   . S/P small bowel resection      Past Surgical History:  Procedure Laterality Date  . COLON SURGERY    . KNEE SURGERY       Social History   Tobacco Use  Smoking Status Never Smoker  Smokeless Tobacco Never Used    Social History   Substance and Sexual Activity  Alcohol Use Yes   Comment: occ     Family History  Problem Relation Age of Onset  . Heart disease Brother      Current Outpatient Medications on File Prior to Visit  Medication Sig Dispense Refill  . FENUGREEK PO Take 1 tablet by mouth daily.    . Melatonin 10 MG CAPS Take 2 tablets by mouth daily as needed.    . Multiple Vitamins-Minerals (ECHINACEA ACZ PO) Take 1,300 mg by mouth daily as needed.    . Multiple Vitamins-Minerals (ZINC PO) Take 50 mg by mouth at bedtime.    . valACYclovir (VALTREX) 500 MG tablet Take 500 mg by mouth daily.     No current facility-administered medications on file prior to visit.    Cardiovascular and other pertinent studies:  EKG 04/29/2020: Sinus rhythm 84 bpm with rate variation   Echocardiogram 02/17/2020: 1. Left ventricular ejection fraction, by estimation, is 60 to 65%. The  left ventricle has normal function. The left ventricle has no regional  wall motion abnormalities. Left ventricular diastolic parameters were  normal.  2. Right ventricular systolic  function is normal. The right ventricular  size is normal. There is normal pulmonary artery systolic pressure.  3. The mitral valve is normal in structure. No evidence of mitral valve  regurgitation. No evidence of mitral stenosis.  4. The aortic valve is normal in structure. Aortic valve regurgitation is  not visualized. No aortic stenosis is present.  5. The inferior vena cava is normal in size with greater than 50%  respiratory variability, suggesting right atrial pressure of 3 mmHg.   CT abdomen 02/16/2020: Fatty liver. Cholelithiasis without complicating factors. Postsurgical changes in the right lower quadrant consistent with the prior small bowel resection. No other focal abnormality is noted.   Recent labs: 04/15/2020: Glucose 82, BUN/Cr 4/0.80. EGFR 122. Na/K 138/3.8. Rest of the CMP normal H/H 11.9/36.5. MCV 86.7. Platelets 213 HbA1C N/A% Chol 159, TG 73, HDL 72, LDL 72 TSH 1.09 normal    Review of Systems  Cardiovascular: Positive for palpitations. Negative for chest pain, dyspnea on exertion, leg swelling and syncope.         Vitals:   04/29/20 1509  BP: 133/81  Pulse: 98  Resp: 16  Temp: 98 F (36.7 C)  SpO2: 98%     Body mass index is 25.04 kg/m. Filed Weights   04/29/20 1509  Weight: 124 lb (56.2 kg)     Objective:   Physical Exam  Vitals and nursing note reviewed.  Constitutional:      General: She is not in acute distress. Neck:     Vascular: No JVD.  Cardiovascular:     Rate and Rhythm: Normal rate and regular rhythm.     Heart sounds: Normal heart sounds. No murmur heard.   Pulmonary:     Effort: Pulmonary effort is normal.     Breath sounds: Normal breath sounds. No wheezing or rales.           Assessment & Recommendations:   20 y.o. African American female with tachycardia  Tachycardia: Currently absent. Normal exam, resting EKG Will monitor Appewatch for any evidence of tachyarrhythmia Encourage increasing  physical activity, hydration.  Further recommendations after above testing   Thank you for referring the patient to [place]. Please feel free to contact with any questions.   Nigel Mormon, MD Pager: [phone] Office: [phone]

## 2020-04-29 ENCOUNTER — Other Ambulatory Visit: Payer: Self-pay

## 2020-04-29 ENCOUNTER — Ambulatory Visit: Payer: Managed Care, Other (non HMO) | Admitting: Cardiology

## 2020-04-29 ENCOUNTER — Encounter: Payer: Self-pay | Admitting: Cardiology

## 2020-04-29 VITALS — BP 133/81 | HR 98 | Temp 98.0°F | Resp 16 | Ht 59.0 in | Wt 124.0 lb

## 2020-04-29 DIAGNOSIS — R000 Tachycardia, unspecified: Secondary | ICD-10-CM

## 2020-05-14 ENCOUNTER — Telehealth: Payer: Self-pay

## 2020-05-14 NOTE — Telephone Encounter (Signed)
Pt is unable to send info through her smart watch and is requesting that she have a monitor placed instead. Are you able to put in the orders for it?

## 2020-05-14 NOTE — Telephone Encounter (Signed)
Error

## 2020-05-17 ENCOUNTER — Other Ambulatory Visit: Payer: Self-pay | Admitting: Cardiology

## 2020-05-17 DIAGNOSIS — R000 Tachycardia, unspecified: Secondary | ICD-10-CM

## 2020-05-17 NOTE — Telephone Encounter (Signed)
Can you guys schedule this pt for a monitor?

## 2020-05-17 NOTE — Telephone Encounter (Signed)
Can you guys schedule this pt for a monitor?

## 2020-05-17 NOTE — Progress Notes (Signed)
   Covid-19 Vaccination Clinic  Name:  Katrina Dominguez    MRN: [id number] DOB: 07-14-2000  05/17/2020  [name] was observed post Covid-19 immunization for 15 minutes without incident. She was provided with Vaccine Information Sheet and instruction to access the V-Safe system.   [name] was instructed to call 911 with any severe reactions post vaccine: Marland Kitchen Difficulty breathing  . Swelling of face and throat  . A fast heartbeat  . A bad rash all over body  . Dizziness and weakness   Immunizations Administered    Name Date Dose VIS Date Route   Pfizer COVID-19 Vaccine 01/22/2020  9:00 AM 0.3 mL 10/22/2019 Intramuscular   Manufacturer: [employer], [employer]   Lot: [id number]   NDC: [address]-7

## 2020-05-17 NOTE — Telephone Encounter (Signed)
Please arrange 2 week non-live monitor for tachycardia.  Thanks MJP

## 2020-05-24 ENCOUNTER — Inpatient Hospital Stay: Payer: Managed Care, Other (non HMO)

## 2020-05-24 DIAGNOSIS — R000 Tachycardia, unspecified: Secondary | ICD-10-CM

## 2020-05-27 ENCOUNTER — Other Ambulatory Visit (HOSPITAL_COMMUNITY)
Admission: RE | Admit: 2020-05-27 | Discharge: 2020-05-27 | Disposition: A | Discharge status: Home / Self Care | Payer: Managed Care, Other (non HMO) | Source: Ambulatory Visit | Attending: Family Medicine | Admitting: Family Medicine

## 2020-05-27 ENCOUNTER — Other Ambulatory Visit: Payer: Self-pay | Admitting: Family Medicine

## 2020-05-27 DIAGNOSIS — Z124 Encounter for screening for malignant neoplasm of cervix: Secondary | ICD-10-CM | POA: Diagnosis not present

## 2020-05-27 DIAGNOSIS — Z113 Encounter for screening for infections with a predominantly sexual mode of transmission: Secondary | ICD-10-CM | POA: Insufficient documentation

## 2020-05-28 LAB — MOLECULAR ANCILLARY ONLY
Bacterial Vaginitis (gardnerella): NEGATIVE
Candida Glabrata: NEGATIVE
Candida Vaginitis: NEGATIVE
Chlamydia: NEGATIVE
Comment: NEGATIVE
Comment: NEGATIVE
Comment: NEGATIVE
Comment: NEGATIVE
Comment: NEGATIVE
Comment: NORMAL
Neisseria Gonorrhea: NEGATIVE
Trichomonas: NEGATIVE

## 2020-06-14 NOTE — Progress Notes (Signed)
Called pt no answer, left a vm

## 2020-06-14 NOTE — Progress Notes (Signed)
Pt aware of results 

## 2020-07-14 ENCOUNTER — Ambulatory Visit: Payer: Managed Care, Other (non HMO) | Admitting: Cardiology

## 2021-10-20 IMAGING — CT CT ABD-PELV W/ CM
2 of 4 series · 16 of 46 positions shown, 18 images · IV contrast (omnipaque)
Comparison: None.

CLINICAL DATA: Abdominal pain for 1 day

EXAM:
CT ABDOMEN AND PELVIS WITH CONTRAST
TECHNIQUE: Multidetector CT imaging of the abdomen and pelvis was performed
using the standard protocol following bolus administration of
intravenous contrast.
CONTRAST:  80mL OMNIPAQUE IOHEXOL 300 MG/ML  SOLN

[Series 3: abdomen 5.0 · axial · 0.65mm/px · z∈[+813,+1178]mm · 13 of 83 slices shown, 15 images]
[im 5/83  soft-tissue]
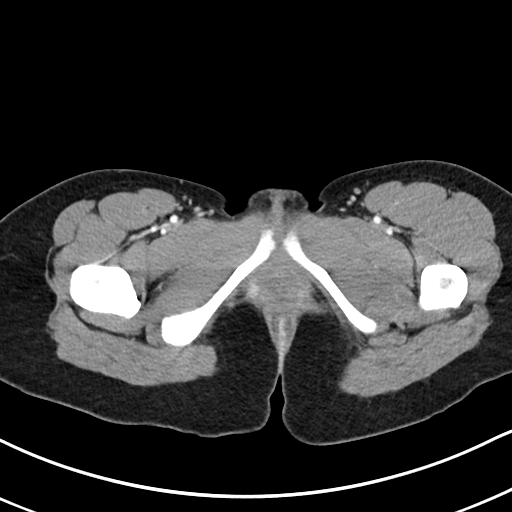
[im 5/83  bone]
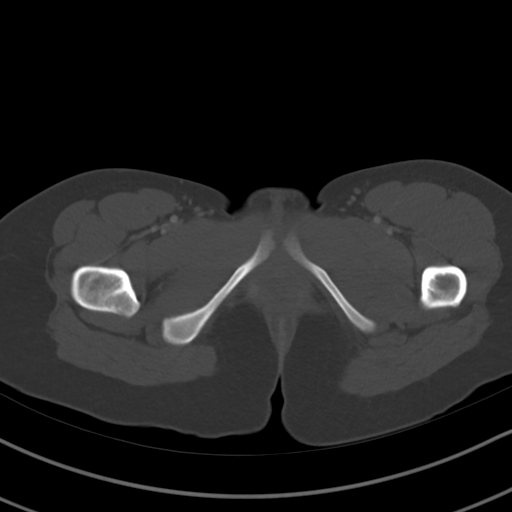
[im 10/83  soft-tissue]
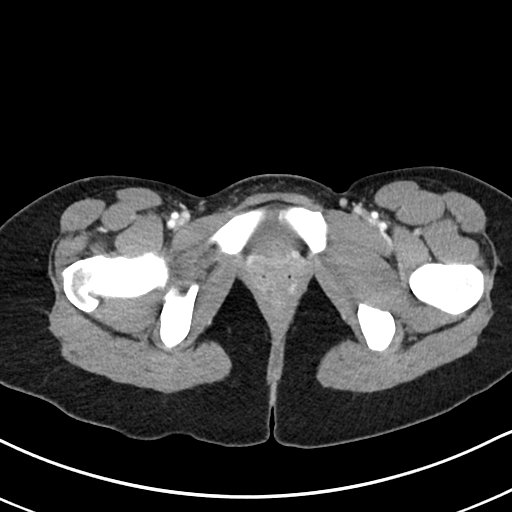
[im 19/83  soft-tissue]
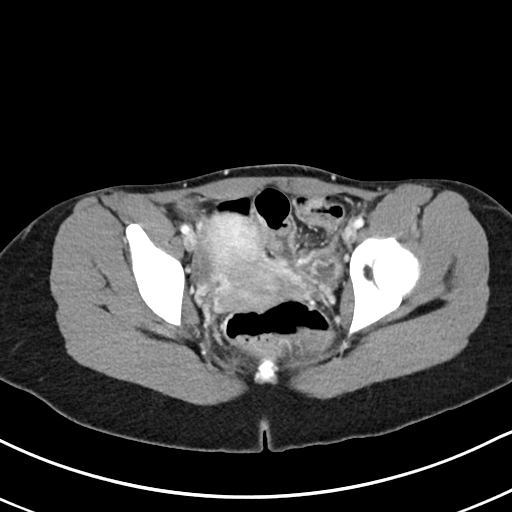
[im 23/83  soft-tissue]
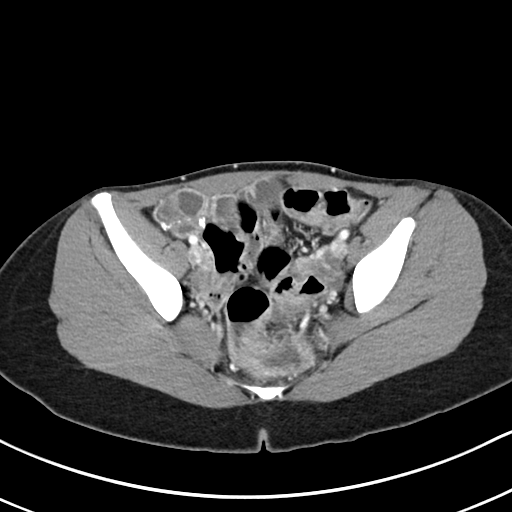
[im 28/83  soft-tissue]
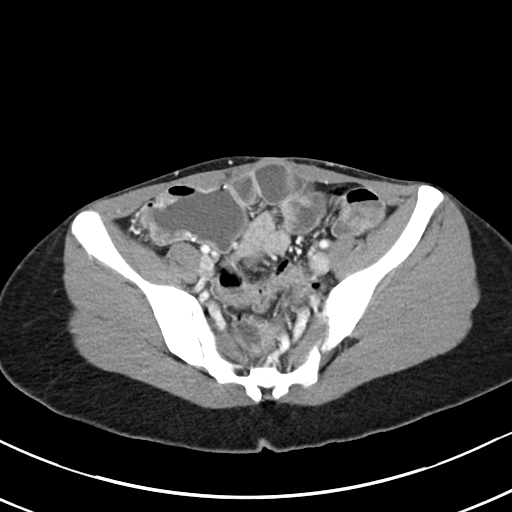
[im 37/83  soft-tissue]
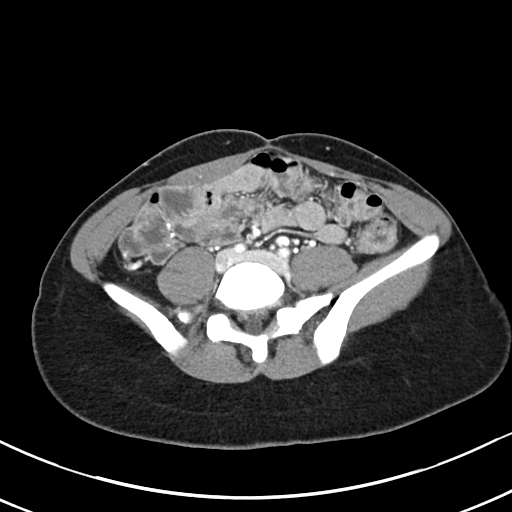
[im 42/83  soft-tissue]
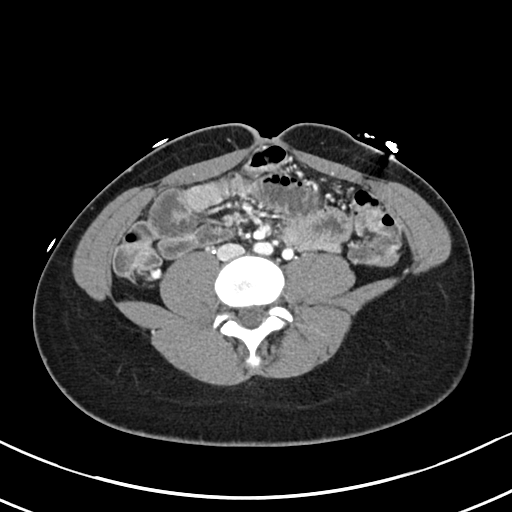
[im 46/83  soft-tissue]
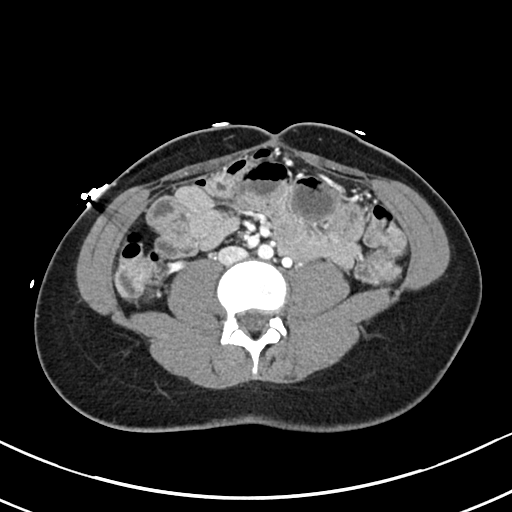
[im 55/83  soft-tissue]
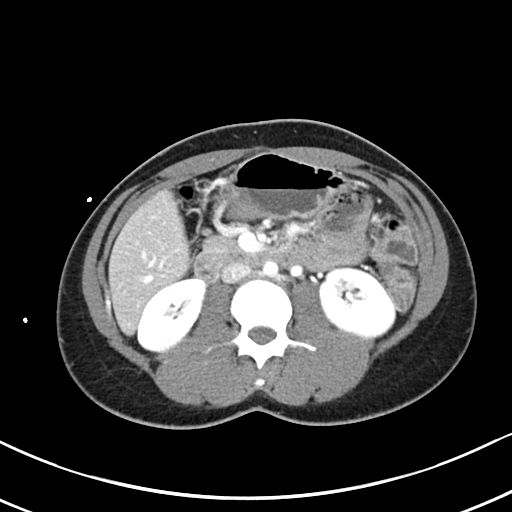
[im 55/83  bone]
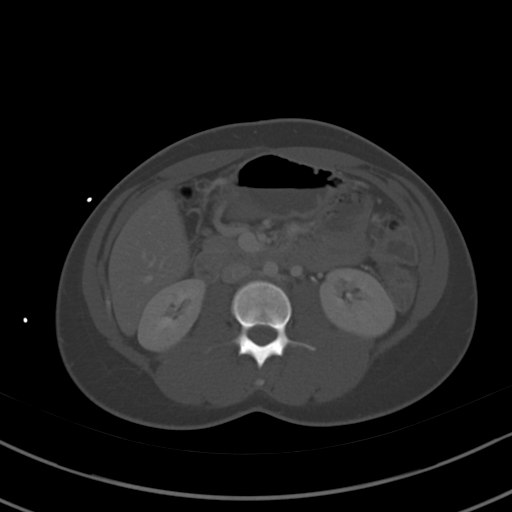
[im 60/83  soft-tissue]
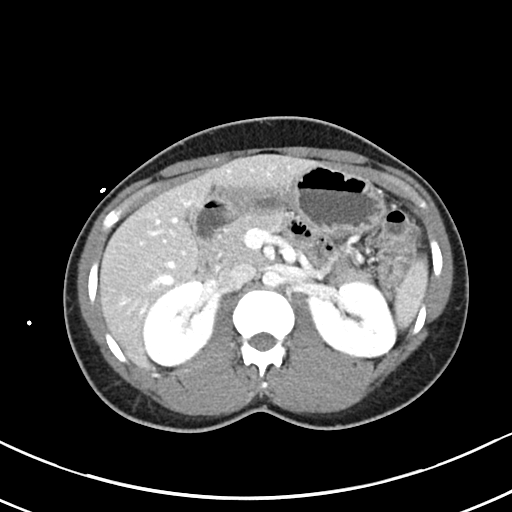
[im 64/83  soft-tissue]
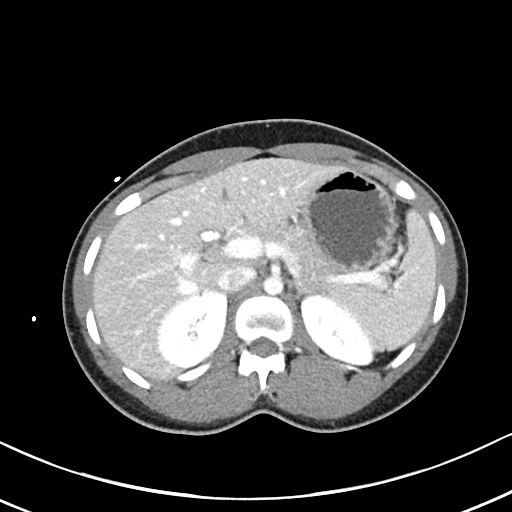
[im 73/83  soft-tissue]
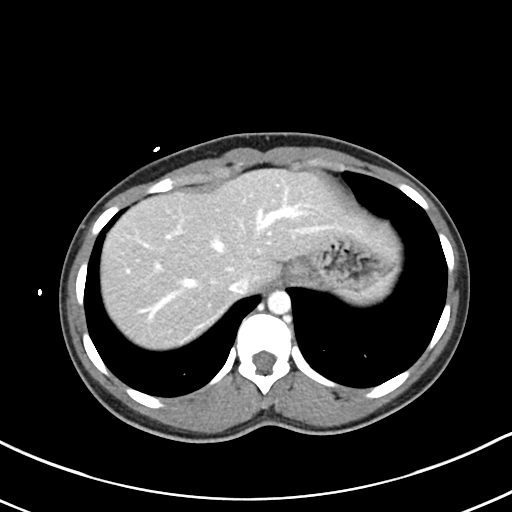
[im 78/83  soft-tissue]
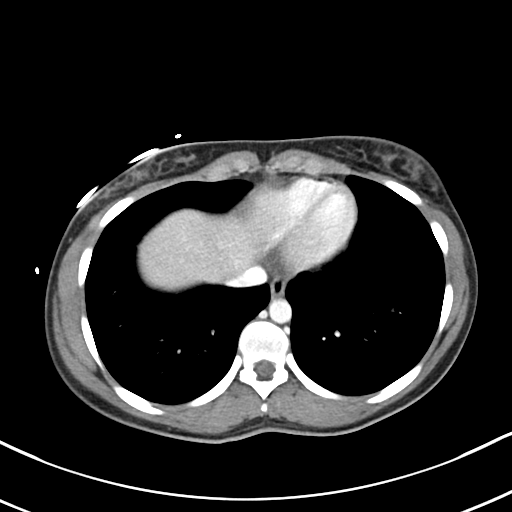

[Series 6: abdomen 3.0 mpr cor · coronal · 0.65mm/px · 3 of 74 slices shown]
[im 25/74  soft-tissue]
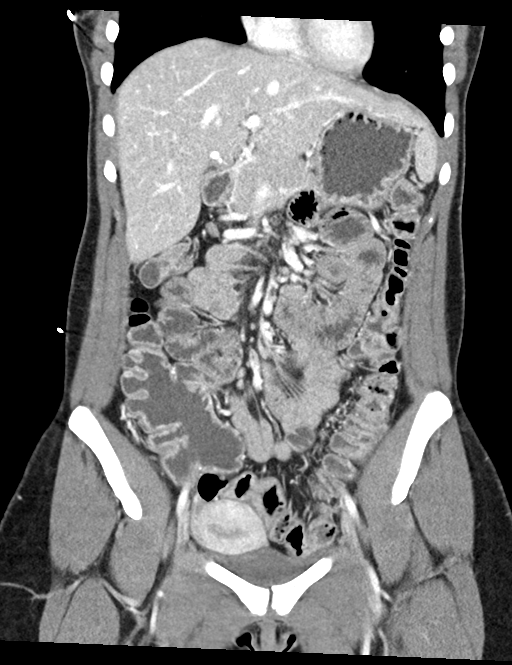
[im 33/74  soft-tissue]
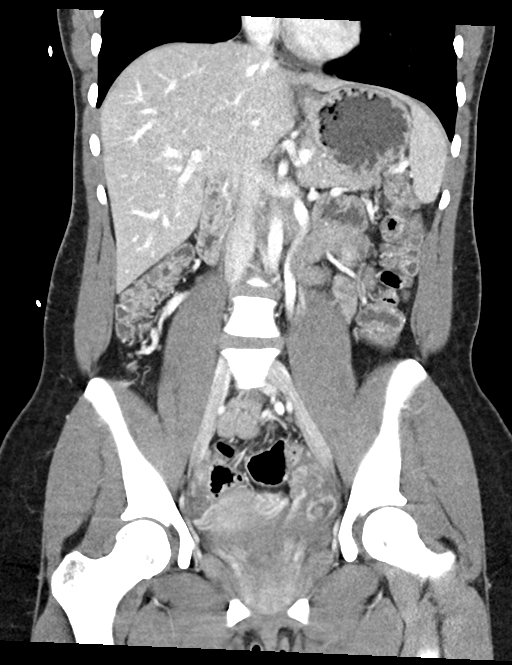
[im 41/74  soft-tissue]
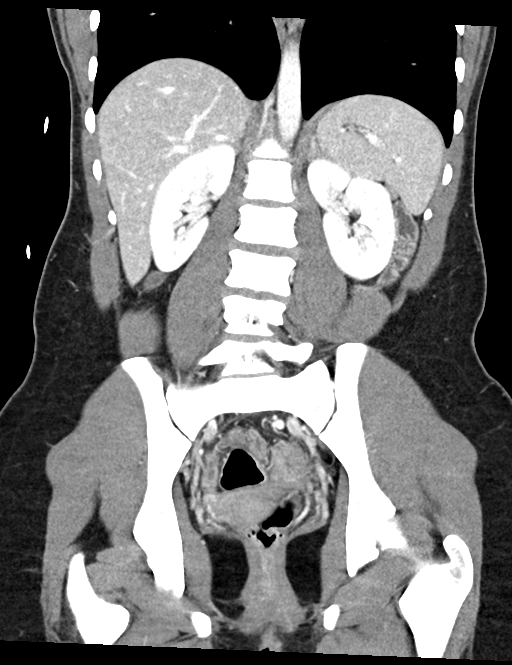

[16 of 46 positions shown; findings below may reference images not displayed]

FINDINGS: Lower chest: No acute abnormality.

Hepatobiliary: Fatty infiltration of the liver is noted. Gallbladder
is decompressed with gallstones within. No complicating factors are
noted.

Pancreas: Unremarkable. No pancreatic ductal dilatation or
surrounding inflammatory changes.

Spleen: Normal in size without focal abnormality.

Adrenals/Urinary Tract: Adrenal glands are within normal limits.
Kidneys demonstrate a normal enhancement pattern bilaterally. No
renal calculi or obstructive changes are seen. The bladder is
decompressed.

Stomach/Bowel: The appendix is not well visualized. Postsurgical
changes in proximal right colon are noted. The anastomotic site is
widely patent. No obstructive changes are seen.

Vascular/Lymphatic: No significant vascular findings are present. No
enlarged abdominal or pelvic lymph nodes.

Reproductive: Uterus and bilateral adnexa are unremarkable.

Other: No abdominal wall hernia or abnormality. No abdominopelvic
ascites.

Musculoskeletal: No acute or significant osseous findings.
IMPRESSION: Fatty liver.

Cholelithiasis without complicating factors.

Postsurgical changes in the right lower quadrant consistent with the
prior small bowel resection.

No other focal abnormality is noted.
